# Patient Record
Sex: Female | Born: 1980 | Race: White | Hispanic: No | Marital: Married | Smoking: Former smoker
Health system: Southern US, Community
[De-identification: ages and names within clinical notes are randomized; demographics above are authoritative.]

## PROBLEM LIST (undated history)

## (undated) ENCOUNTER — Inpatient Hospital Stay: Payer: Self-pay

## (undated) DIAGNOSIS — E559 Vitamin D deficiency, unspecified: Secondary | ICD-10-CM

## (undated) DIAGNOSIS — O26819 Pregnancy related exhaustion and fatigue, unspecified trimester: Secondary | ICD-10-CM

## (undated) DIAGNOSIS — O99210 Obesity complicating pregnancy, unspecified trimester: Secondary | ICD-10-CM

## (undated) DIAGNOSIS — F419 Anxiety disorder, unspecified: Secondary | ICD-10-CM

## (undated) HISTORY — DX: Pregnancy related exhaustion and fatigue, unspecified trimester: O26.819

## (undated) HISTORY — DX: Obesity complicating pregnancy, unspecified trimester: O99.210

## (undated) HISTORY — DX: Vitamin D deficiency, unspecified: E55.9

## (undated) HISTORY — DX: Anxiety disorder, unspecified: F41.9

---
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
## 2001-07-18 ENCOUNTER — Other Ambulatory Visit: Admission: RE | Admit: 2001-07-18 | Discharge: 2001-07-18 | Payer: Self-pay | Admitting: Gynecology

## 2006-05-17 ENCOUNTER — Ambulatory Visit (HOSPITAL_COMMUNITY): Admission: RE | Admit: 2006-05-17 | Discharge: 2006-05-17 | Payer: Self-pay | Admitting: Obstetrics & Gynecology

## 2006-07-15 ENCOUNTER — Ambulatory Visit (HOSPITAL_COMMUNITY): Admission: RE | Admit: 2006-07-15 | Discharge: 2006-07-15 | Payer: Self-pay | Admitting: Obstetrics & Gynecology

## 2006-07-22 ENCOUNTER — Ambulatory Visit (HOSPITAL_COMMUNITY): Admission: RE | Admit: 2006-07-22 | Discharge: 2006-07-22 | Payer: Self-pay | Admitting: Obstetrics & Gynecology

## 2006-08-27 ENCOUNTER — Ambulatory Visit (HOSPITAL_COMMUNITY): Admission: RE | Admit: 2006-08-27 | Discharge: 2006-08-27 | Payer: Self-pay | Admitting: Obstetrics & Gynecology

## 2006-12-05 ENCOUNTER — Inpatient Hospital Stay (HOSPITAL_COMMUNITY): Admission: AD | Admit: 2006-12-05 | Discharge: 2006-12-08 | Payer: Self-pay | Admitting: Obstetrics and Gynecology

## 2008-04-03 IMAGING — US US OB DETAIL+14 WK
1 series · 13 of 28 positions shown · non-contrast
Comparison: none

CLINICAL DATA: Anatomic evaluation.  No current problems.

[Series 1: us ob detail+14 wk · 0.31mm/px · 13 of 88 slices shown]
[im 4/88]
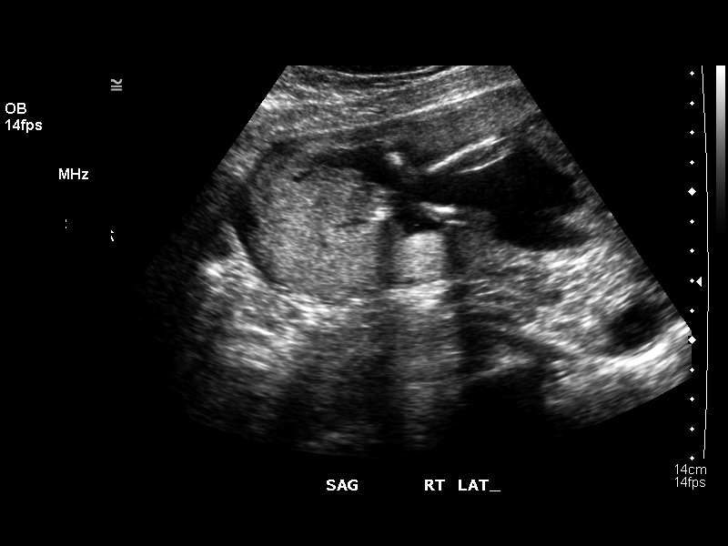
[im 10/88]
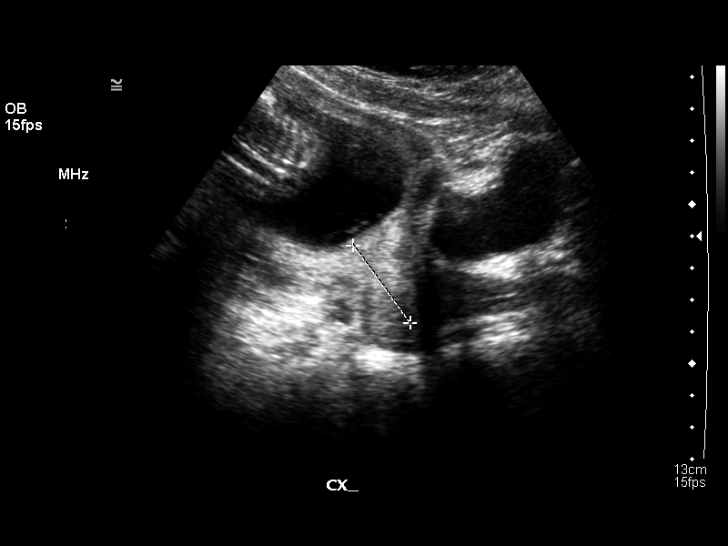
[im 17/88]
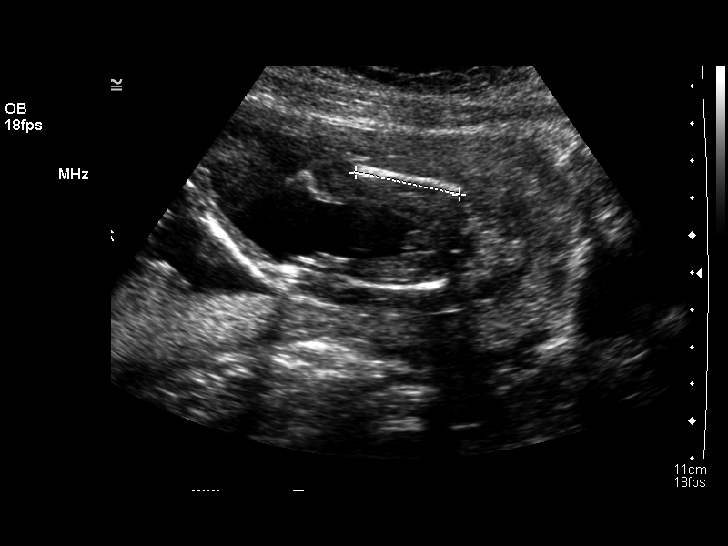
[im 23/88]
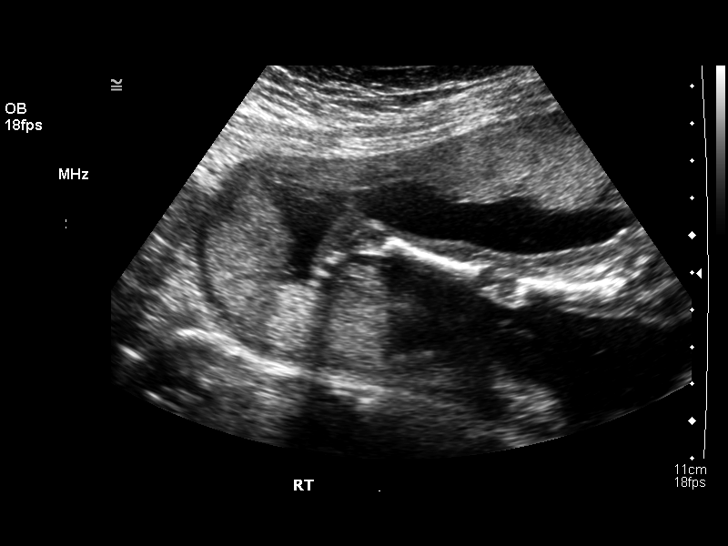
[im 30/88]
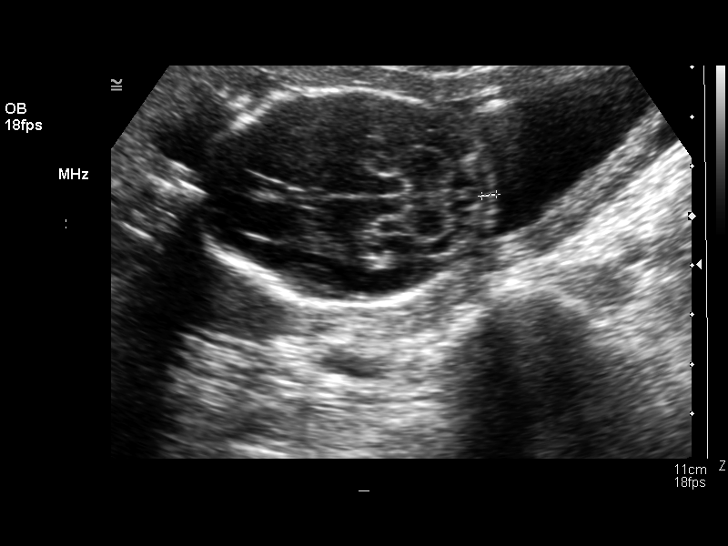
[im 36/88]
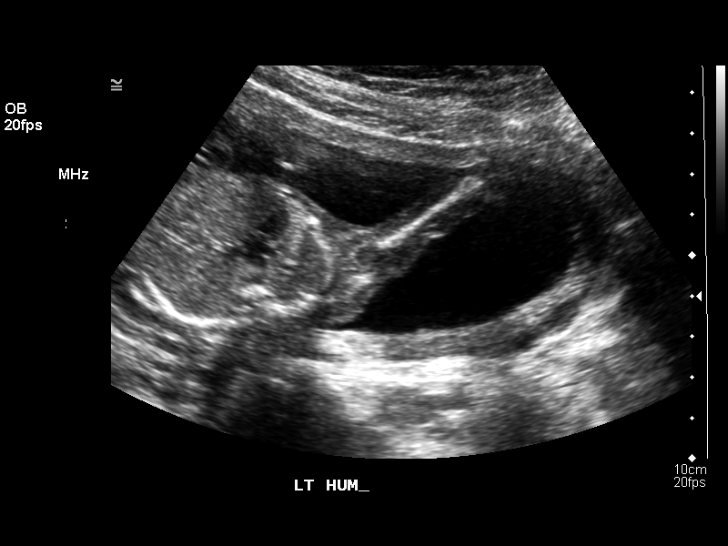
[im 46/88]
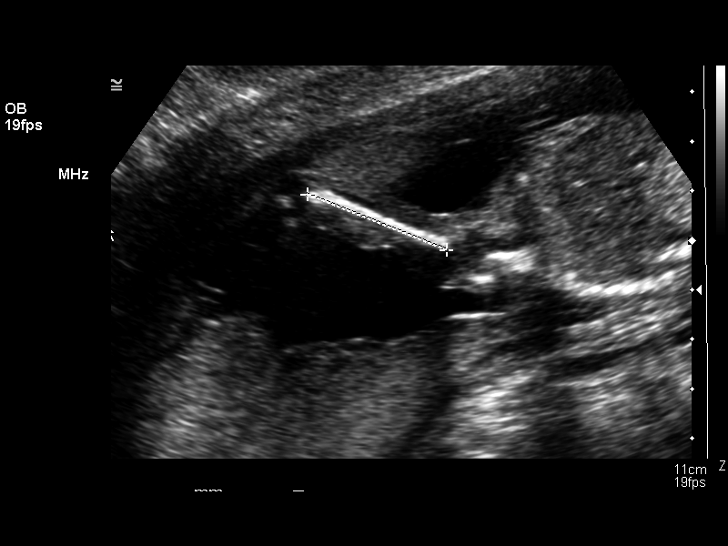
[im 52/88]
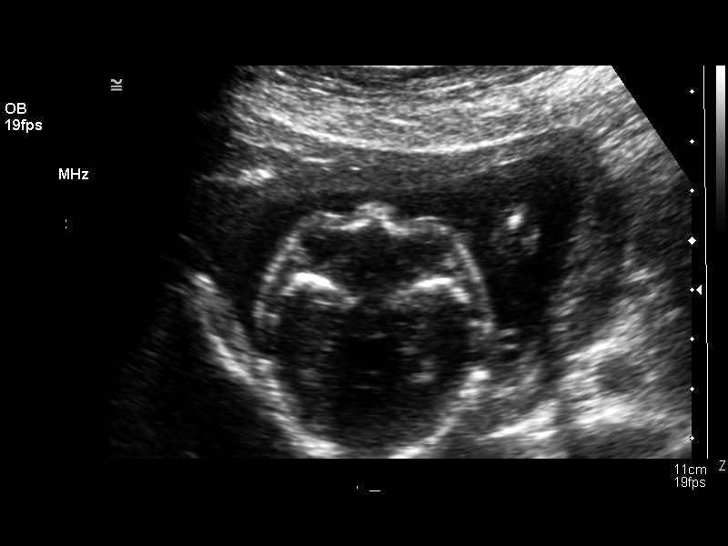
[im 59/88]
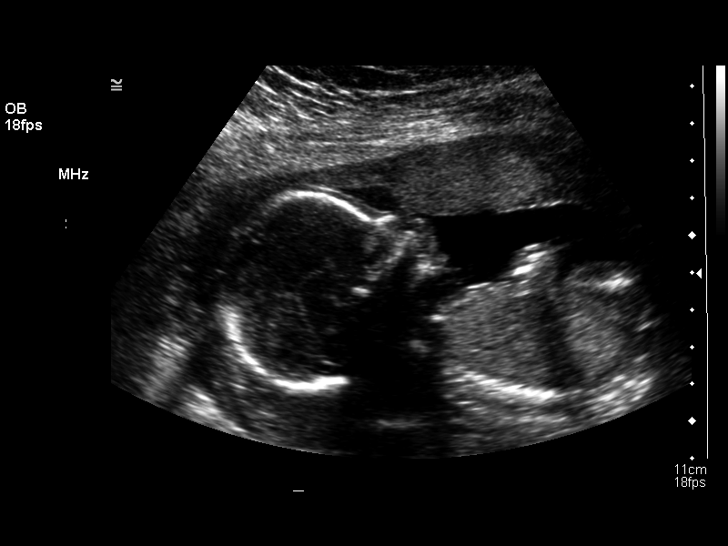
[im 65/88]
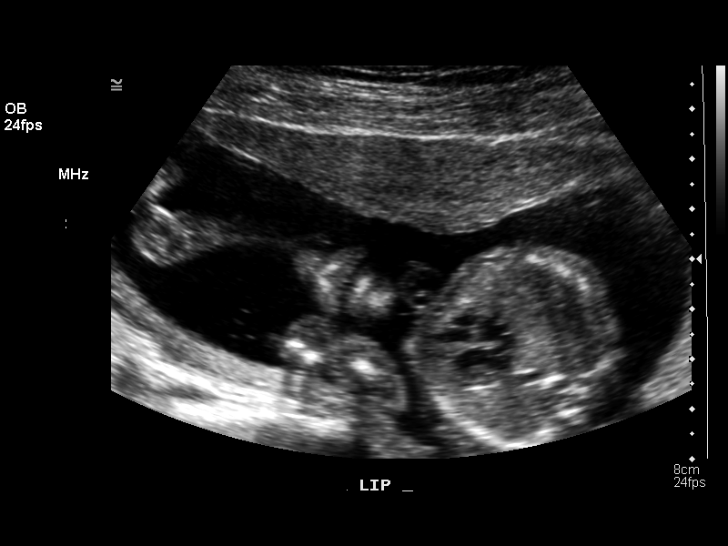
[im 71/88]
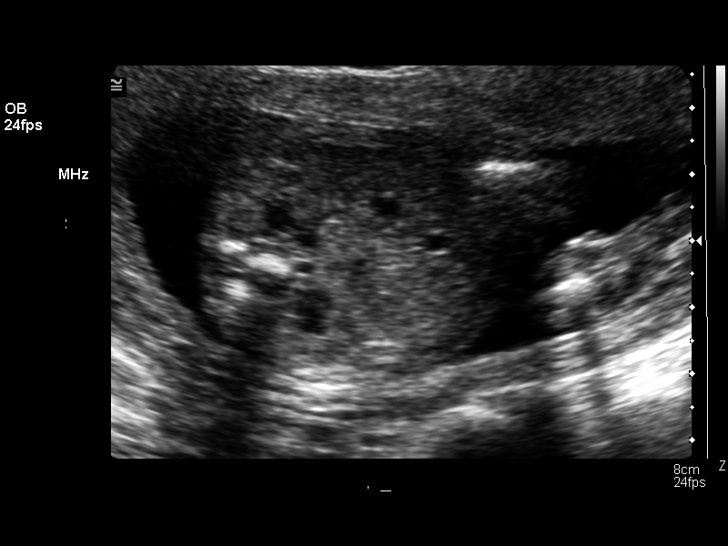
[im 78/88]
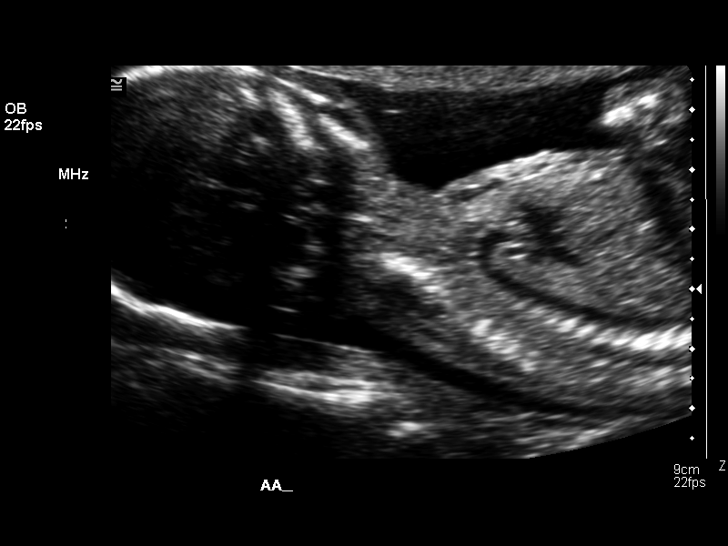
[im 84/88]
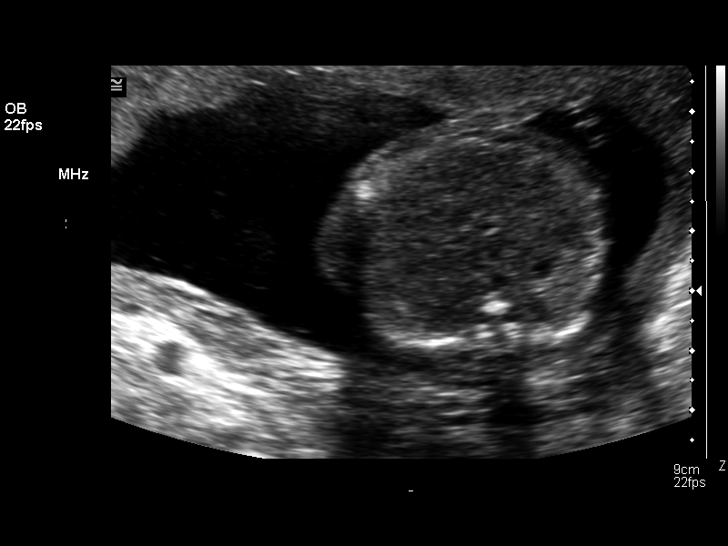

[13 of 28 positions shown; findings below may reference images not displayed]

OBSTETRICAL ULTRASOUND:
 Number of Fetuses:  1
 Heart Rate:  141 bpm
 Movement:  Yes
 Breathing:  No  
 Presentation:  Breech
 Placental Location:  Right lateral
 Grade:  I
 Previa:  No
 Amniotic Fluid (Subjective):  Normal
 Amniotic Fluid (Objective):   3.1 cm vertical pocket

 FETAL BIOMETRY
 BPD:  4.5 cm  19 w 5 d
 HC:  16.9 cm  19 w 4 d
 AC:  14.1 cm   19 w 3 d
 FL:  2.9 cm  18 w 6 d
 HL:  3.0 cm  20 w 0 d

 MEAN GA:  19 w 4 d   US EDC:  12/05/06  
 Assigned GA:  18 w 4 d  Assigned EDC:  12/12/06

 FETAL ANATOMY
 Lateral Ventricles:  Visualized 
 Thalami/CSP:  Visualized 
 Posterior Fossa:  Visualized 
 Nuchal Region:  NF= 3.0 mm  Visualized   
 Spine:  Not visualized 
 4 Chamber Heart on Left:  Visualized 
 Stomach on Left:  Visualized 
 3 Vessel Cord:  Visualized 
 Cord Insertion site:  Visualized 
 Kidneys:  Visualized 
 Bladder:  Visualized   
 Extremities:  Visualized    

 ADDITIONAL ANATOMY VISUALIZED:  LVOT, RVOT, upper lip, orbits, profile, diaphragm, heel, 5th digit, ductal arch, and aortic arch.

 MATERNAL UTERINE AND ADNEXAL FINDINGS
 Cervix:   3.0 cm transabdominal.
IMPRESSION: 1.  Single intrauterine pregnancy demonstrating an estimated gestational age by ultrasound of 19 weeks 4 days.  This is 1 week ahead of assigned gestational age by LMP of 18 weeks 4 days.  
 2.  The anatomic exam was notable for an initial small stomach size, which demonstrated filling during the course of the evaluation and by the end of the exam was felt to be within normal limits.  The fetal spine could not be assessed with clarity due to persistent spine-down fetal positioning.  The remainder of the anatomic evaluation was within normal limits.  Follow-up evaluation has been scheduled at the patient?s convenience in approximately 1 week for hopeful improved visualization of the fetal spine.  Reassessment of the fetal stomach can be undertaken at this time as well to confirm normal filled appearance.  
 3.  Subjectively and quantitatively normal amniotic fluid volume and normal cervical length.

## 2008-05-16 IMAGING — US US OB FOLLOW-UP
1 series · 13 of 24 positions shown · non-contrast
Comparison: none

CLINICAL DATA: Evaluate growth; hypertension; assigned gestational age is 24 weeks 5 days.

[Series 1: us ob follow-up · 0.33mm/px · 13 of 24 slices shown]
[im 1/24]
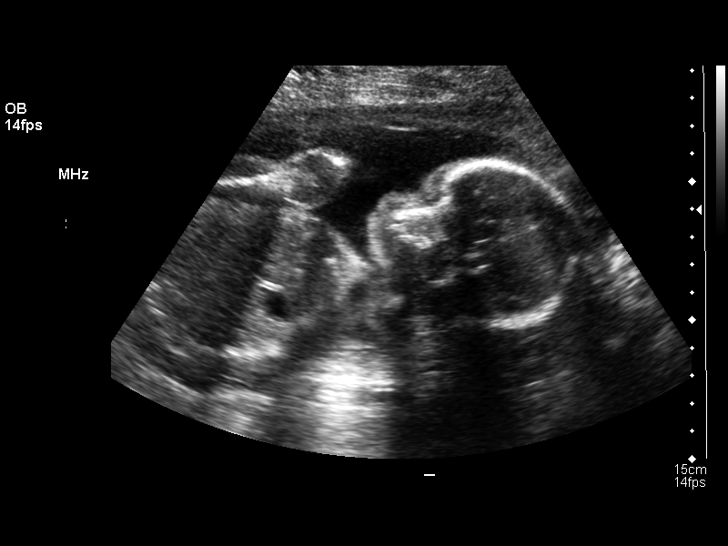
[im 3/24]
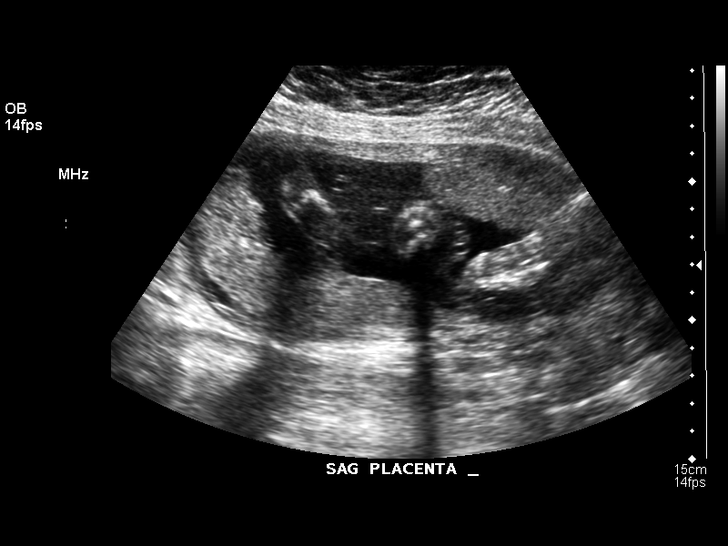
[im 5/24]
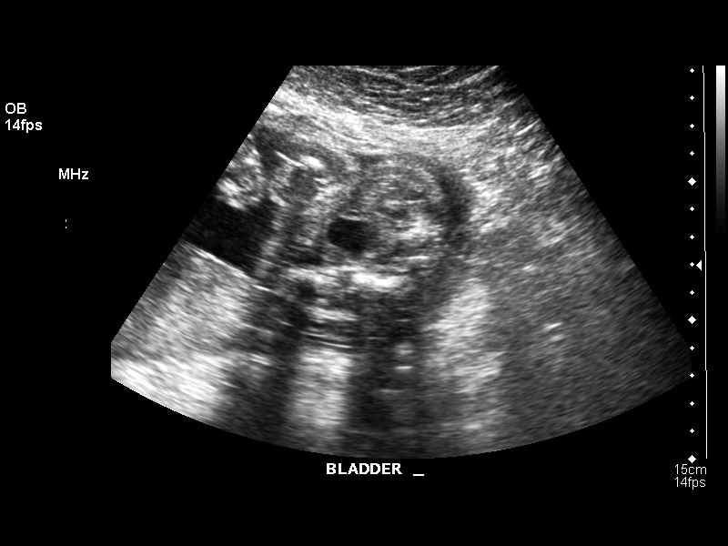
[im 7/24]
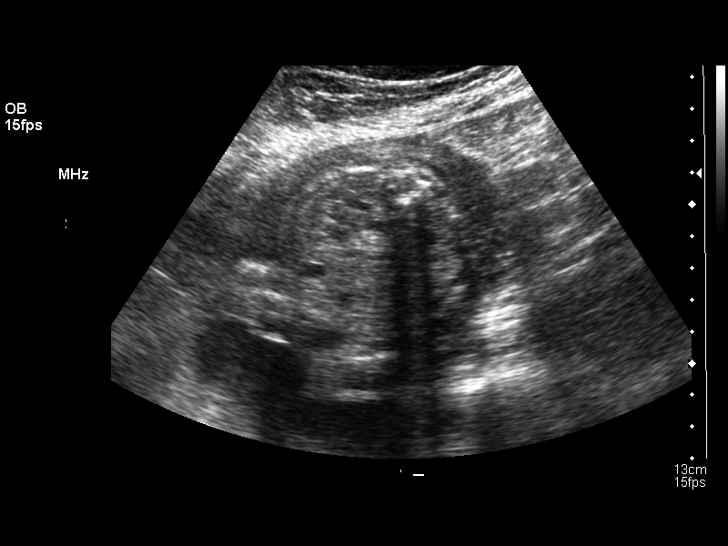
[im 9/24]
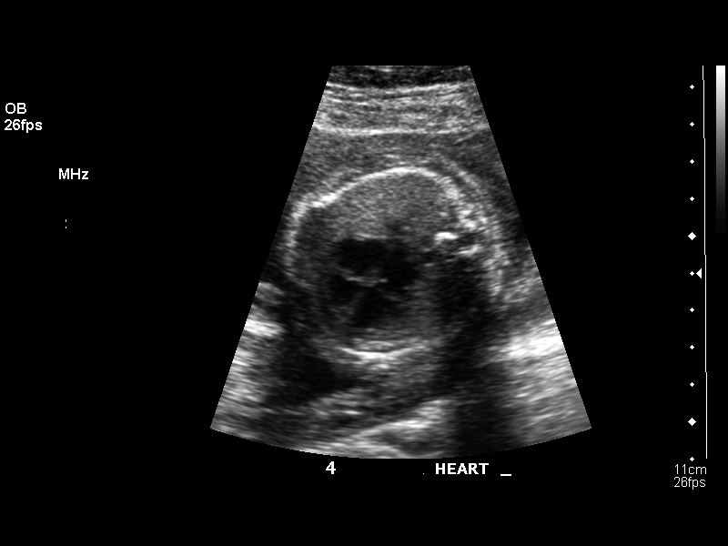
[im 11/24]
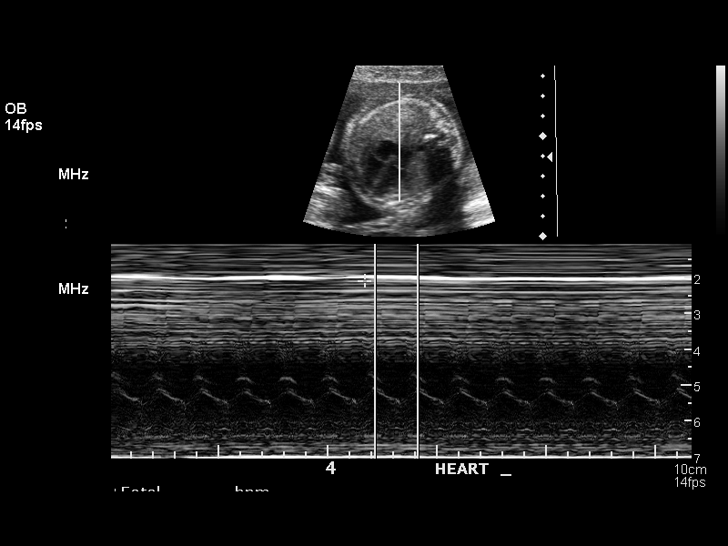
[im 13/24]
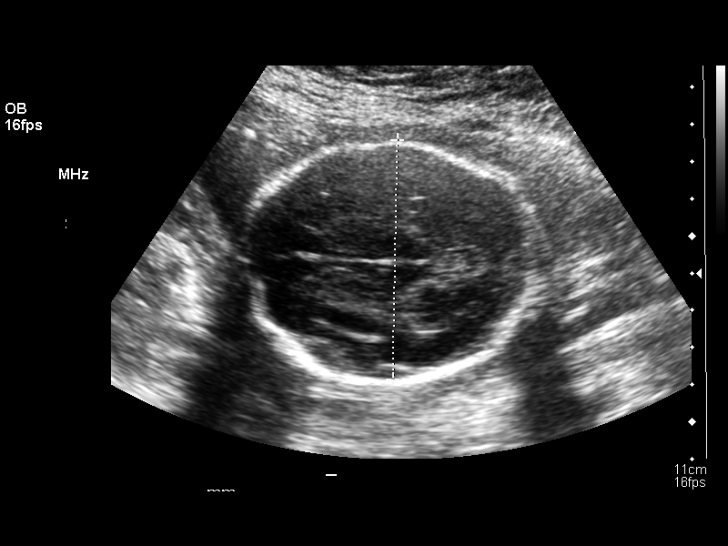
[im 14/24]
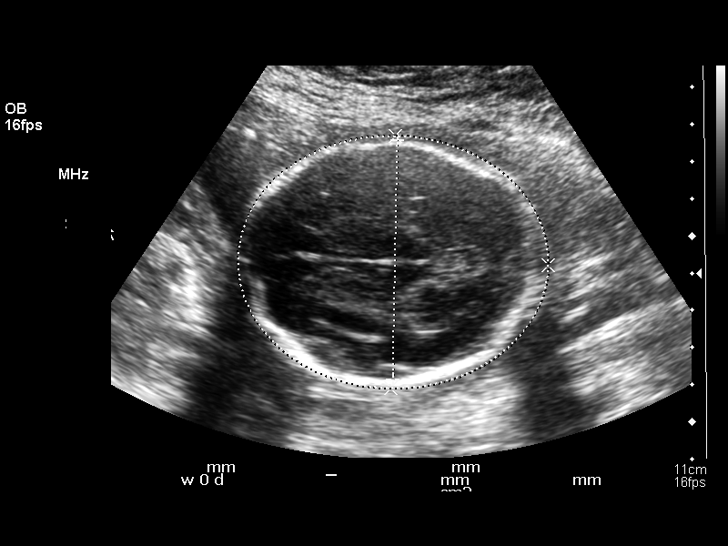
[im 16/24]
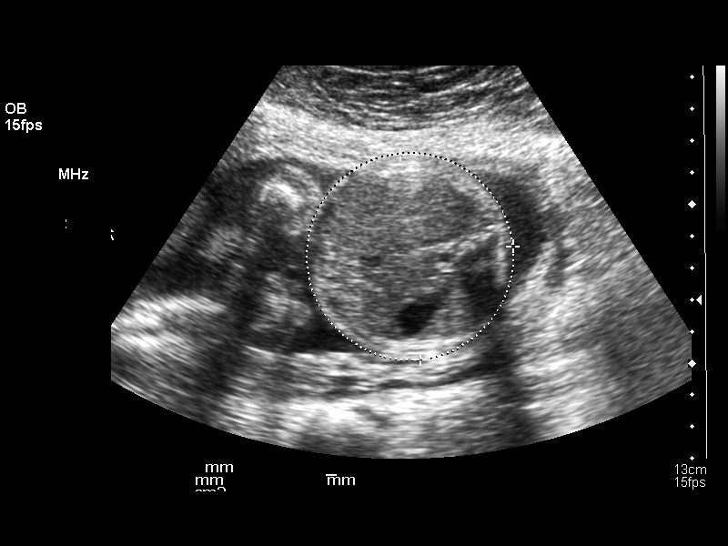
[im 18/24]
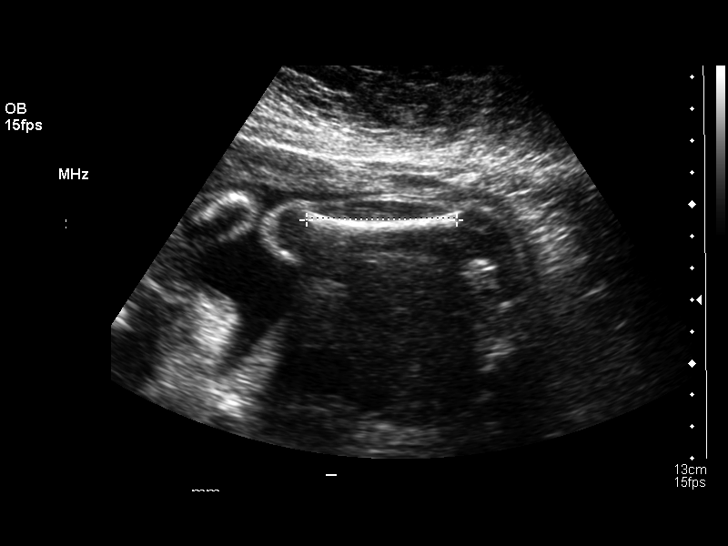
[im 20/24]
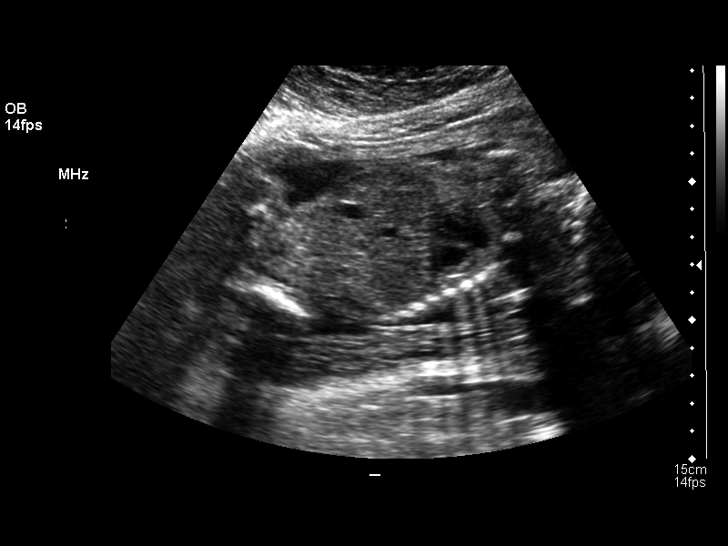
[im 22/24]
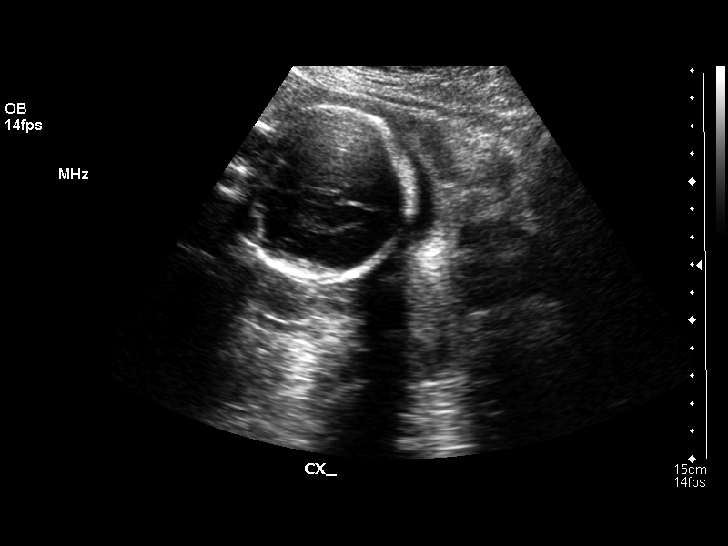
[im 24/24]
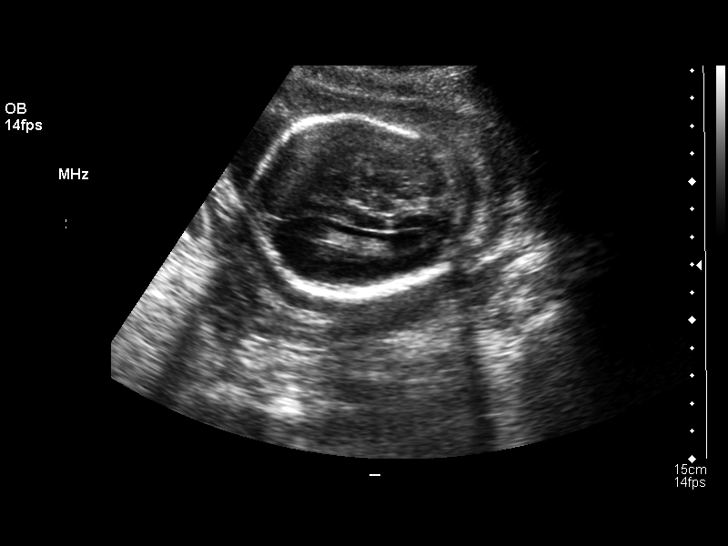

[13 of 24 positions shown; findings below may reference images not displayed]

OBSTETRICAL ULTRASOUND RE-EVALUATION:
 Number of Fetuses:  1
 Heart Rate:  153 bpm
 Movement:  Yes
 Breathing:  No
 Presentation:  Cephalic
 Placental Location:  Posterior, right lateral
 Grade:  I
 Previa:  No
 Amniotic Fluid (subjective):  Normal
 Amniotic Fluid (objective):  4.4 cm vertical pocket 

 FETAL BIOMETRY
 BPD:  6.4 cm   26 w 0 d 
 HC:  23.7 cm   25 w 5 d 
 AC:  20.3 cm   24 w 6 d 
 FL:  4.7 cm   25 w 4 d 

 Mean GA: 25 w 4 d   US EDC:  24 w 5 d 
 Assigned GA:  24 w 5 d   Assigned EDC:  12/12/06

 EFW:  792 grams 50th ? 75th %ile (740 ? 890 g) for 25 weeks 

 FETAL ANATOMY
 Lateral Ventricles:  Visualized 
 Thalami/CSP:  Previously visualized 
 Posterior Fossa:  Previously visualized 
 Nuchal Region:  Previously visualized 
 Spine:  Previously visualized 
 4 Chamber Heart on Left:  Visualized 
 Stomach on Left:  Visualized 
 3 Vessel Cord:  Previously visualized 
 Cord Insertion Site:  Previously visualized 
 Kidneys:  Visualized 
 Bladder:    Visualized 
 Extremities:  Previously visualized 

 ADDITIONAL ANATOMY VISUALIZED:  Diaphragm.  

 MATERNAL UTERINE AND ADNEXAL FINDINGS
 Cervix:  3.7 cm transabdominal.
IMPRESSION: There is a single living intrauterine gestation in cephalic presentation.  The mean gestational age by today?s ultrasound is 25 weeks 4 days, which is concordant with the assigned gestational age.  Fetal indices are concordant and the estimated fetal weight is at the 50-75th percentile for a 25 week gestation.

## 2010-11-09 ENCOUNTER — Encounter: Payer: Self-pay | Admitting: Obstetrics & Gynecology

## 2014-09-06 LAB — OB RESULTS CONSOLE GC/CHLAMYDIA
CHLAMYDIA, DNA PROBE: NEGATIVE
GC PROBE AMP, GENITAL: NEGATIVE

## 2014-09-06 LAB — OB RESULTS CONSOLE PLATELET COUNT: Platelets: 225 10*3/uL

## 2014-09-06 LAB — OB RESULTS CONSOLE ABO/RH: RH Type: POSITIVE

## 2014-09-06 LAB — OB RESULTS CONSOLE RUBELLA ANTIBODY, IGM: RUBELLA: IMMUNE

## 2014-09-06 LAB — OB RESULTS CONSOLE RPR: RPR: NONREACTIVE

## 2014-09-06 LAB — OB RESULTS CONSOLE HEPATITIS B SURFACE ANTIGEN: HEP B S AG: NEGATIVE

## 2014-09-06 LAB — OB RESULTS CONSOLE ANTIBODY SCREEN: Antibody Screen: NEGATIVE

## 2014-09-06 LAB — OB RESULTS CONSOLE HGB/HCT, BLOOD
HEMATOCRIT: 41 %
Hemoglobin: 14.1 g/dL

## 2014-09-06 LAB — OB RESULTS CONSOLE VARICELLA ZOSTER ANTIBODY, IGG: Varicella: IMMUNE

## 2014-09-06 LAB — OB RESULTS CONSOLE HIV ANTIBODY (ROUTINE TESTING): HIV: NONREACTIVE

## 2014-10-19 NOTE — L&D Delivery Note (Signed)
Delivery Note At 9:59 PM a viable female was delivered via Vaginal, Spontaneous Delivery (Presentation: Right Occiput Anterior).  APGAR: 8, 9; weight 6 lb 7.7 oz (2940 g).   Placenta status: Intact, Spontaneous.  Cord: 3 vessels with the following complications: None.  Cord pH: did not obtain.  Anesthesia: Epidural  Episiotomy: None Lacerations: right labia minora Suture Repair: 3.0 vicryl Est. Blood Loss (mL): 100  Mom to postpartum.  Baby to Couplet care / Skin to Skin.  Hildred Laser 03/28/2015, 8:32 AM

## 2014-12-26 LAB — OB RESULTS CONSOLE HGB/HCT, BLOOD
HCT: 36 %
HEMOGLOBIN: 12.4 g/dL

## 2015-03-20 ENCOUNTER — Encounter: Payer: Self-pay | Admitting: Obstetrics and Gynecology

## 2015-03-20 ENCOUNTER — Ambulatory Visit (INDEPENDENT_AMBULATORY_CARE_PROVIDER_SITE_OTHER): Payer: Medicaid Other | Admitting: Obstetrics and Gynecology

## 2015-03-20 VITALS — BP 132/79 | HR 102 | Wt 230.9 lb

## 2015-03-20 DIAGNOSIS — O208 Other hemorrhage in early pregnancy: Secondary | ICD-10-CM | POA: Insufficient documentation

## 2015-03-20 DIAGNOSIS — F411 Generalized anxiety disorder: Secondary | ICD-10-CM

## 2015-03-20 DIAGNOSIS — Z3493 Encounter for supervision of normal pregnancy, unspecified, third trimester: Secondary | ICD-10-CM

## 2015-03-20 DIAGNOSIS — O99210 Obesity complicating pregnancy, unspecified trimester: Secondary | ICD-10-CM

## 2015-03-20 DIAGNOSIS — R7309 Other abnormal glucose: Secondary | ICD-10-CM

## 2015-03-20 DIAGNOSIS — E669 Obesity, unspecified: Secondary | ICD-10-CM | POA: Insufficient documentation

## 2015-03-20 DIAGNOSIS — O418X10 Other specified disorders of amniotic fluid and membranes, first trimester, not applicable or unspecified: Secondary | ICD-10-CM | POA: Insufficient documentation

## 2015-03-20 DIAGNOSIS — E559 Vitamin D deficiency, unspecified: Secondary | ICD-10-CM | POA: Insufficient documentation

## 2015-03-20 DIAGNOSIS — F419 Anxiety disorder, unspecified: Secondary | ICD-10-CM | POA: Insufficient documentation

## 2015-03-20 DIAGNOSIS — E785 Hyperlipidemia, unspecified: Secondary | ICD-10-CM | POA: Insufficient documentation

## 2015-03-20 DIAGNOSIS — Z3483 Encounter for supervision of other normal pregnancy, third trimester: Secondary | ICD-10-CM

## 2015-03-20 DIAGNOSIS — O468X1 Other antepartum hemorrhage, first trimester: Secondary | ICD-10-CM

## 2015-03-20 LAB — POCT URINALYSIS DIPSTICK
Bilirubin, UA: NEGATIVE
Blood, UA: NEGATIVE
Glucose, UA: NEGATIVE
Ketones, UA: NEGATIVE
NITRITE UA: NEGATIVE
PH UA: 6.5
Spec Grav, UA: 1.02
UROBILINOGEN UA: 0.2

## 2015-03-20 NOTE — Patient Instructions (Signed)
RTC in 1 week for routine OB visit.  Fetal kick counts twice daily.  Labor precautions.

## 2015-03-20 NOTE — Progress Notes (Signed)
ROB: Patient c/o occasional dizziness, not affected by positional changes. Reports occasional palpitations when lying down.  Pulse with mild tachycardia today. Also with contractions x 3 hrs yesterday which subsided. Will continue to monitor. Labor precautions given. RTC in 1 week.

## 2015-03-25 ENCOUNTER — Telehealth: Payer: Self-pay | Admitting: Obstetrics and Gynecology

## 2015-03-25 NOTE — Telephone Encounter (Signed)
Spoke with pt she states that she has been experiencing contractions for the past few days. Pt states that the contractions are getting stronger in intensity however are still coming in an irregular pattern. Pt states that the contractions are approx an hour apart and last anywhere between 15-3630mins. Pt denies vaginal bleeding, and leaking vaginal fluid. Pt states she believes she has lost her mucus plug. Advised pt to go to ER immediately if she has vaginal bleeding or begins to leak vaginal fluid. Advised pt to head to L&D if contractions became regular occuring every 5-7 minutes for an hour consecutively. Pt gave verbal understanding and stated she just wanted some reassurance.

## 2015-03-26 ENCOUNTER — Encounter: Payer: Self-pay | Admitting: *Deleted

## 2015-03-26 ENCOUNTER — Observation Stay
Admission: EM | Admit: 2015-03-26 | Discharge: 2015-03-26 | Disposition: A | Discharge status: Home / Self Care | Payer: Medicaid Other | Source: Home / Self Care | Admitting: Obstetrics and Gynecology

## 2015-03-26 DIAGNOSIS — R109 Unspecified abdominal pain: Secondary | ICD-10-CM | POA: Insufficient documentation

## 2015-03-26 DIAGNOSIS — O26893 Other specified pregnancy related conditions, third trimester: Secondary | ICD-10-CM

## 2015-03-26 NOTE — Discharge Instructions (Signed)
Reviewed labor precautions. Pt to keep appt with Dr Valentino Saxonherry on Thurs as scheduled. Encouraged to call her Dr. Maxie Barbr L and D dept if further concerns or questions arise. Pt and spouse voiced understanding, no further questons or concerns at this time.

## 2015-03-26 NOTE — Progress Notes (Signed)
Pt and spouse provided with labor precaution instructions. Encouraged to call Dr or [facility] de[facility] with any further questions or concerns. Pt instructed o keep her appt. with Dr Valentino Saxonherry on Thurs as scheduled. Pt and spouse verbalizing understanding. No further questions asked. Pt left dept. Walking with spouse to ER dept parking.

## 2015-03-26 NOTE — Progress Notes (Signed)
BP cuff switched and retaken

## 2015-03-26 NOTE — Progress Notes (Signed)
Dr Valentino Saxonherry stating that pt may walk if desired. Re-evaluate cx in 1 hour or prn.

## 2015-03-27 ENCOUNTER — Encounter: Payer: Self-pay | Admitting: *Deleted

## 2015-03-27 ENCOUNTER — Encounter: Payer: Self-pay | Admitting: Obstetrics and Gynecology

## 2015-03-27 ENCOUNTER — Inpatient Hospital Stay: Payer: Medicaid Other | Admitting: Anesthesiology

## 2015-03-27 ENCOUNTER — Inpatient Hospital Stay
Admission: EM | Admit: 2015-03-27 | Discharge: 2015-03-29 | DRG: 775 | Disposition: A | Discharge status: Home / Self Care | Payer: Medicaid Other | Attending: Obstetrics and Gynecology | Admitting: Obstetrics and Gynecology

## 2015-03-27 ENCOUNTER — Observation Stay
Admission: EM | Admit: 2015-03-27 | Discharge: 2015-03-27 | Disposition: A | Discharge status: Home / Self Care | Payer: Medicaid Other | Source: Home / Self Care | Attending: Emergency Medicine | Admitting: Emergency Medicine

## 2015-03-27 DIAGNOSIS — O468X1 Other antepartum hemorrhage, first trimester: Secondary | ICD-10-CM

## 2015-03-27 DIAGNOSIS — Z3A39 39 weeks gestation of pregnancy: Secondary | ICD-10-CM | POA: Diagnosis present

## 2015-03-27 DIAGNOSIS — Z3493 Encounter for supervision of normal pregnancy, unspecified, third trimester: Secondary | ICD-10-CM | POA: Diagnosis present

## 2015-03-27 DIAGNOSIS — O99213 Obesity complicating pregnancy, third trimester: Secondary | ICD-10-CM | POA: Diagnosis present

## 2015-03-27 DIAGNOSIS — E559 Vitamin D deficiency, unspecified: Secondary | ICD-10-CM | POA: Diagnosis present

## 2015-03-27 DIAGNOSIS — R000 Tachycardia, unspecified: Secondary | ICD-10-CM | POA: Diagnosis present

## 2015-03-27 DIAGNOSIS — O99284 Endocrine, nutritional and metabolic diseases complicating childbirth: Secondary | ICD-10-CM | POA: Diagnosis present

## 2015-03-27 DIAGNOSIS — Z3483 Encounter for supervision of other normal pregnancy, third trimester: Secondary | ICD-10-CM | POA: Diagnosis not present

## 2015-03-27 DIAGNOSIS — O418X10 Other specified disorders of amniotic fluid and membranes, first trimester, not applicable or unspecified: Secondary | ICD-10-CM

## 2015-03-27 LAB — CBC
HCT: 36.7 % (ref 35.0–47.0)
Hemoglobin: 11.9 g/dL — ABNORMAL LOW (ref 12.0–16.0)
MCH: 28.1 pg (ref 26.0–34.0)
MCHC: 32.4 g/dL (ref 32.0–36.0)
MCV: 86.9 fL (ref 80.0–100.0)
Platelets: 191 10*3/uL (ref 150–440)
RBC: 4.23 MIL/uL (ref 3.80–5.20)
RDW: 12.6 % (ref 11.5–14.5)
WBC: 13.8 10*3/uL — ABNORMAL HIGH (ref 3.6–11.0)

## 2015-03-27 LAB — TYPE AND SCREEN
ABO/RH(D): B POS
Antibody Screen: NEGATIVE

## 2015-03-27 LAB — ABO/RH: ABO/RH(D): B POS

## 2015-03-27 MED ORDER — OXYTOCIN 40 UNITS IN LACTATED RINGERS INFUSION - SIMPLE MED
INTRAVENOUS | Status: AC
  Administered 2015-03-27: 62.5 mL/h via INTRAVENOUS
  Filled 2015-03-27: qty 1000

## 2015-03-27 MED ORDER — METOCLOPRAMIDE HCL 10 MG PO TABS
10.0000 mg | ORAL_TABLET | Freq: Once | ORAL | Status: DC
  Filled 2015-03-27: qty 1

## 2015-03-27 MED ORDER — MISOPROSTOL 200 MCG PO TABS
ORAL_TABLET | ORAL | Status: AC
  Filled 2015-03-27: qty 4

## 2015-03-27 MED ORDER — LACTATED RINGERS IV SOLN
INTRAVENOUS | Status: DC
  Administered 2015-03-27: 20:00:00 via INTRAVENOUS

## 2015-03-27 MED ORDER — DIPHENHYDRAMINE HCL 50 MG/ML IJ SOLN: 12.5000 mg | INTRAMUSCULAR | Status: DC | PRN

## 2015-03-27 MED ORDER — FENTANYL 2.5 MCG/ML W/ROPIVACAINE 0.2% IN NS 100 ML EPIDURAL INFUSION (ARMC-ANES)
EPIDURAL | Status: AC
  Administered 2015-03-27: 20 mL/h via EPIDURAL
  Administered 2015-03-27: 9 mL/h via EPIDURAL
  Filled 2015-03-27: qty 100

## 2015-03-27 MED ORDER — BUTORPHANOL TARTRATE 1 MG/ML IJ SOLN
INTRAMUSCULAR | Status: AC
Start: 2015-03-27 — End: 2015-03-27
  Administered 2015-03-27: 1 mg via INTRAVENOUS
  Filled 2015-03-27: qty 2

## 2015-03-27 MED ORDER — EPHEDRINE 5 MG/ML INJ
10.0000 mg | INTRAVENOUS | Status: DC | PRN
  Filled 2015-03-27: qty 2

## 2015-03-27 MED ORDER — FAMOTIDINE 20 MG PO TABS
40.0000 mg | ORAL_TABLET | Freq: Once | ORAL | Status: DC
  Filled 2015-03-27: qty 1

## 2015-03-27 MED ORDER — FENTANYL 2.5 MCG/ML W/ROPIVACAINE 0.2% IN NS 100 ML EPIDURAL INFUSION (ARMC-ANES)
9.0000 mL/h | EPIDURAL | Status: DC
  Administered 2015-03-27: 9 mL/h via EPIDURAL

## 2015-03-27 MED ORDER — HYDROCODONE-ACETAMINOPHEN 5-325 MG PO TABS
1.0000 | ORAL_TABLET | Freq: Once | ORAL | Status: AC | PRN
  Administered 2015-03-27: 1 via ORAL

## 2015-03-27 MED ORDER — CITRIC ACID-SODIUM CITRATE 334-500 MG/5ML PO SOLN: 30.0000 mL | ORAL | Status: DC | PRN

## 2015-03-27 MED ORDER — AMMONIA AROMATIC IN INHA
RESPIRATORY_TRACT | Status: AC
  Filled 2015-03-27: qty 10

## 2015-03-27 MED ORDER — LIDOCAINE HCL (PF) 1 % IJ SOLN
INTRAMUSCULAR | Status: AC
  Filled 2015-03-27: qty 30

## 2015-03-27 MED ORDER — LACTATED RINGERS IV SOLN: INTRAVENOUS | Status: DC

## 2015-03-27 MED ORDER — OXYTOCIN 40 UNITS IN LACTATED RINGERS INFUSION - SIMPLE MED
62.5000 mL/h | INTRAVENOUS | Status: DC
  Administered 2015-03-27: 62.5 mL/h via INTRAVENOUS

## 2015-03-27 MED ORDER — OXYTOCIN BOLUS FROM INFUSION
500.0000 mL | INTRAVENOUS | Status: DC
  Administered 2015-03-27: 500 mL via INTRAVENOUS

## 2015-03-27 MED ORDER — HYDROCODONE-ACETAMINOPHEN 5-325 MG PO TABS
ORAL_TABLET | ORAL | Status: AC
  Administered 2015-03-27: 1 via ORAL
  Filled 2015-03-27: qty 1

## 2015-03-27 MED ORDER — ACETAMINOPHEN 325 MG PO TABS: 650.0000 mg | ORAL_TABLET | ORAL | Status: DC | PRN

## 2015-03-27 MED ORDER — LACTATED RINGERS IV SOLN: 500.0000 mL | INTRAVENOUS | Status: DC | PRN

## 2015-03-27 MED ORDER — OXYTOCIN 10 UNIT/ML IJ SOLN
INTRAMUSCULAR | Status: AC
  Filled 2015-03-27: qty 2

## 2015-03-27 MED ORDER — ONDANSETRON HCL 4 MG/2ML IJ SOLN: 4.0000 mg | Freq: Four times a day (QID) | INTRAMUSCULAR | Status: DC | PRN

## 2015-03-27 MED ORDER — PHENYLEPHRINE 40 MCG/ML (10ML) SYRINGE FOR IV PUSH (FOR BLOOD PRESSURE SUPPORT)
80.0000 ug | PREFILLED_SYRINGE | INTRAVENOUS | Status: DC | PRN
  Filled 2015-03-27: qty 2

## 2015-03-27 MED ORDER — BUTORPHANOL TARTRATE 1 MG/ML IJ SOLN
1.0000 mg | INTRAMUSCULAR | Status: DC | PRN
  Administered 2015-03-27: 1 mg via INTRAVENOUS

## 2015-03-27 NOTE — H&P (Addendum)
Obstetric History and Physical  Christie Harrison is a 34 y.o. G2P1001 with IUP at 21w3dpresenting for contractions. LMP 06/25/2015, EDD 03/31/2015 by dates consistent with 7 week sono. Patient states she has been having  regular, every 2-4 minutes contractions, no vaginal bleeding, intact membranes, with active fetal movement.    Prenatal Course Source of Care: [facility]  with onset of care at 10 weeks Pregnancy complications or risks: Patient Active Problem List   Diagnosis Date Noted  . Labor and delivery, indication for care 03/27/2015  . Abdominal pain 03/26/2015  . Obesity in pregnancy 03/20/2015  . Anxiety disorder 03/20/2015  . Glucose tolerance test abnormal (1 hr abnormal, normal 3 hr GTT) 03/20/2015  . Subchorionic hemorrhage in first trimester 03/20/2015  . Vitamin D deficiency 03/20/2015  . Elevated lipids 03/20/2015   She plans to breastfeed She is unsure of desires for postpartum contraception.   Prenatal labs and studies: ABO, Rh: --/--/B POS (06/08 1646) Antibody: NEG (06/08 1646) Rubella: Non- Immune (11/19 2015) RPR: Nonreactive (11/19 2015)  HBsAg: Negative (11/19 2015)  HIV: Non-reactive (11/19 2015)  GBS: negative 1 hr Glucola: abnormal (146); 3 hr GTT normal Genetic screening declined [employer]    Past Medical History  Diagnosis Date  . Anxiety   . Obesity in pregnancy   . Fatigue in pregnancy   . Vitamin D deficiency     Past Surgical History  Procedure Laterality Date  . Mandible fracture surgery  1996    OB History  Gravida Para Term Preterm AB SAB TAB Ectopic Multiple Living  2 1 1       1     # Outcome Date GA Lbr Len/2nd Weight Sex Delivery Anes PTL Lv  2 Current           1 Term 12/06/06 380w0d3.232 kg (7 lb 2 oz) M Vag-Spont EPI N Y    Obstetric Comments  Pt reports back labor with first labor, vag delivery with epidural    History   Social History  . Marital Status: Married    Spouse Name: N/A  . Number of  Children: N/A  . Years of Education: N/A   Social History Main Topics  . Smoking status: Former Smoker -- 0.25 packs/day for 12 years    Types: Cigarettes    Quit date: 02/16/2006  . Smokeless tobacco: Never Used  . Alcohol Use: No  . Drug Use: No  . Sexual Activity: Yes    Birth Control/ Protection: None     Comment: Currently Pregnant   Other Topics Concern  . None   Social History Narrative    Family History  Problem Relation Age of Onset  . Heart block Maternal Grandfather   . Vision loss Maternal Grandfather   . Vision loss Maternal Grandmother     Prescriptions prior to admission  Medication Sig Dispense Refill Last Dose  . Prenatal Vit-Fe Fumarate-FA (MULTIVITAMIN-PRENATAL) 27-0.8 MG TABS tablet Take 1 tablet by mouth daily at 12 noon.   03/27/2015 at Unknown time    No Known Allergies  Review of Systems: Negative except for what is mentioned in HPI.  Physical Exam: BP 112/27 mmHg  Pulse 123  Temp(Src) 98.4 F (36.9 C) (Oral)  Resp 16  Ht 5' 4"  (1.626 m)  Wt 108.863 kg (240 lb)  BMI 41.18 kg/m2 CONSTITUTIONAL: Well-developed, well-nourished female in no moderate distress.  HENT:  Normocephalic, atraumatic, External right and left ear normal. Oropharynx is clear  and moist EYES: Conjunctivae and EOM are normal. Pupils are equal, round, and reactive to light. No scleral icterus.  NECK: Normal range of motion, supple, no masses SKIN: Skin is warm and dry. No rash noted. Not diaphoretic. No erythema. No pallor. [place]: Alert and oriented to person, place, and time. Normal reflexes, muscle tone coordination. No cranial nerve deficit noted. PSYCHIATRIC: Normal mood and affect. Normal behavior. Normal judgment and thought content. CARDIOVASCULAR: Normal heart rate noted, regular rhythm RESPIRATORY: Effort and breath sounds normal, no problems with respiration noted ABDOMEN: Soft, nontender, nondistended, gravid. MUSCULOSKELETAL: Normal range of motion. No edema  and no tenderness. 2+ distal pulses.  Cervical Exam: Dilatation 4-5cm   Effacement 90%   Station -3   Presentation: cephalic FHT:  Baseline rate 140 bpm   Variability moderate  Accelerations present   Decelerations none Contractions: Every 2-3 mins   Pertinent Labs/Studies:   Results for orders placed or performed during the hospital encounter of 03/27/15 (from the past 24 hour(s))  Type and screen     Status: None   Collection Time: 03/27/15  4:46 PM  Result Value Ref Range   ABO/RH(D) B POS    Antibody Screen NEG    Sample Expiration 03/30/2015   CBC     Status: Abnormal   Collection Time: 03/27/15  4:54 PM  Result Value Ref Range   WBC 13.8 (H) 3.6 - 11.0 K/uL   RBC 4.23 3.80 - 5.20 MIL/uL   Hemoglobin 11.9 (L) 12.0 - 16.0 g/dL   HCT 36.7 35.0 - 47.0 %   MCV 86.9 80.0 - 100.0 fL   MCH 28.1 26.0 - 34.0 pg   MCHC 32.4 32.0 - 36.0 g/dL   RDW 12.6 11.5 - 14.5 %   Platelets 191 150 - 440 K/uL    Assessment : Christie Harrison is a 34 y.o. G2P1001 at 18w3dbeing admitted for labor.  Plan: Labor: Expectant management.  Induction/Augmentation as needed, per protocol. AROM'd at admission with clear fluid.  Stadol for pain, epidural when desired.  FWB: Reassuring fetal heart tracing.  GBS negative Delivery plan: Anticipate  vaginal delivery Needs MMR postpartum.    ARubie Maid MD  [facility] 03/27/2015 6:52 PM

## 2015-03-27 NOTE — Anesthesia Procedure Notes (Signed)
Epidural Patient location during procedure: OB  Staffing Anesthesiologist: Christie AddisonHOMAS, Christie Harrison Performed by: anesthesiologist   Preanesthetic Checklist Completed: patient identified, site marked, surgical consent, pre-op evaluation, timeout performed, IV checked, risks and benefits discussed and monitors and equipment checked  Epidural Patient position: sitting Prep: Betadine Patient monitoring: heart rate, continuous pulse ox and blood pressure Approach: midline Location: L4-L5 Injection technique: LOR saline  Needle:  Needle type: Tuohy  Needle gauge: 18 G Needle length: 9 cm and 9 Catheter type: closed end flexible Catheter size: 20 Guage Test dose: negative and 1.5% lidocaine with Epi 1:200 K  Assessment Sensory level: T10 Events: blood not aspirated, injection not painful, no injection resistance, negative IV test and no paresthesia  Additional Notes   Patient tolerated the insertion well without complications.1808 start. 1814 test dose 3cc 1.5% xylo, 1820 fent and ropivicaine infusion.Reason for block:procedure for pain

## 2015-03-27 NOTE — Anesthesia Preprocedure Evaluation (Signed)
Anesthesia Evaluation  Patient identified by MRN, date of birth, ID band Patient awake    Reviewed: Allergy & Precautions, NPO status , Patient's Chart, lab work & pertinent test results  Airway Mallampati: III  TM Distance: >3 FB     Dental  (+) Chipped   Pulmonary former smoker,          Cardiovascular     Neuro/Psych PSYCHIATRIC DISORDERS Anxiety    GI/Hepatic   Endo/Other    Renal/GU      Musculoskeletal   Abdominal   Peds  Hematology   Anesthesia Other Findings Obesity.  Reproductive/Obstetrics                             Anesthesia Physical Anesthesia Plan  ASA: II  Anesthesia Plan: Epidural   Post-op Pain Management:    Induction:   Airway Management Planned:   Additional Equipment:   Intra-op Plan:   Post-operative Plan:   Informed Consent: I have reviewed the patients History and Physical, chart, labs and discussed the procedure including the risks, benefits and alternatives for the proposed anesthesia with the patient or authorized representative who has indicated his/her understanding and acceptance.     Plan Discussed with:   Anesthesia Plan Comments:         Anesthesia Quick Evaluation

## 2015-03-27 NOTE — Progress Notes (Signed)
Patient 4-590/-3.  Will admit to Labor and Delivery.  SROM'd at 4:35 pm, clear fluid.

## 2015-03-27 NOTE — Progress Notes (Signed)
Intrapartum Progress Note  S: Patient without complaints. Comfortable with epidural.  O: Blood pressure 125/66, pulse 129, temperature 98.6 F (37 C), temperature source Oral, resp. rate 22, height 5\' 4"  (1.626 m), weight 108.863 kg (240 lb). Gen App: NAD, comfortable Abdomen: soft, gravid FHT: 150 bpm, acce3ls present, no decels.  Good variability.  Tocometer: 2-3 cm Cervix:  9.5/90/+2/cephalic. AROM'd.  Extremities: Nontender, no edema.  Labs:  Lab Results  Component Value Date   WBC 13.8* 03/27/2015   HGB 11.9* 03/27/2015   HCT 36.7 03/27/2015   MCV 86.9 03/27/2015   PLT 191 03/27/2015    Assessment:  1: SIUP at 1538w3d 2. Tachycardia (120s-140s, asymptomatic) since admission. Did not resolve after epidural placed and pain decreased.  Plan:  1) Anticipate vaginal delivery soon.  2) Will f/u with tachycardia postpartum.  If still persistently significantly elevated, will order EKG. No prior h/o cardiac arrhythmias.   Hildred LaserAnika Santonio Speakman, MD 03/27/2015 9:27 PM

## 2015-03-28 ENCOUNTER — Encounter: Payer: Medicaid Other | Admitting: Obstetrics and Gynecology

## 2015-03-28 ENCOUNTER — Encounter: Payer: Self-pay | Admitting: *Deleted

## 2015-03-28 LAB — CBC
HCT: 31.9 % — ABNORMAL LOW (ref 35.0–47.0)
Hemoglobin: 10.3 g/dL — ABNORMAL LOW (ref 12.0–16.0)
MCH: 28.1 pg (ref 26.0–34.0)
MCHC: 32.2 g/dL (ref 32.0–36.0)
MCV: 87.3 fL (ref 80.0–100.0)
Platelets: 142 10*3/uL — ABNORMAL LOW (ref 150–440)
RBC: 3.66 MIL/uL — ABNORMAL LOW (ref 3.80–5.20)
RDW: 12.7 % (ref 11.5–14.5)
WBC: 13.6 10*3/uL — AB (ref 3.6–11.0)

## 2015-03-28 LAB — RPR: RPR: NONREACTIVE

## 2015-03-28 MED ORDER — PRENATAL MULTIVITAMIN CH
1.0000 | ORAL_TABLET | Freq: Every day | ORAL | Status: DC
  Administered 2015-03-28: 1 via ORAL
  Filled 2015-03-28: qty 1

## 2015-03-28 MED ORDER — MEASLES, MUMPS & RUBELLA VAC ~~LOC~~ INJ
0.5000 mL | INJECTION | Freq: Once | SUBCUTANEOUS | Status: AC
  Administered 2015-03-29: 0.5 mL via SUBCUTANEOUS
  Filled 2015-03-28 (×2): qty 0.5

## 2015-03-28 MED ORDER — WITCH HAZEL-GLYCERIN EX PADS: 1.0000 "application " | MEDICATED_PAD | CUTANEOUS | Status: DC | PRN

## 2015-03-28 MED ORDER — MAGNESIUM HYDROXIDE 400 MG/5ML PO SUSP: 30.0000 mL | ORAL | Status: DC | PRN

## 2015-03-28 MED ORDER — ONDANSETRON HCL 4 MG PO TABS: 4.0000 mg | ORAL_TABLET | ORAL | Status: DC | PRN

## 2015-03-28 MED ORDER — ZOLPIDEM TARTRATE 5 MG PO TABS: 5.0000 mg | ORAL_TABLET | Freq: Every evening | ORAL | Status: DC | PRN

## 2015-03-28 MED ORDER — SIMETHICONE 80 MG PO CHEW
80.0000 mg | CHEWABLE_TABLET | ORAL | Status: DC | PRN
Start: 2015-03-28 — End: 2015-03-29

## 2015-03-28 MED ORDER — ACETAMINOPHEN 325 MG PO TABS: 650.0000 mg | ORAL_TABLET | ORAL | Status: DC | PRN

## 2015-03-28 MED ORDER — LANOLIN HYDROUS EX OINT: TOPICAL_OINTMENT | CUTANEOUS | Status: DC | PRN

## 2015-03-28 MED ORDER — DIPHENHYDRAMINE HCL 25 MG PO CAPS: 25.0000 mg | ORAL_CAPSULE | Freq: Four times a day (QID) | ORAL | Status: DC | PRN

## 2015-03-28 MED ORDER — LACTATED RINGERS IV SOLN: INTRAVENOUS | Status: DC

## 2015-03-28 MED ORDER — BENZOCAINE-MENTHOL 20-0.5 % EX AERO: 1.0000 "application " | INHALATION_SPRAY | CUTANEOUS | Status: DC | PRN

## 2015-03-28 MED ORDER — IBUPROFEN 600 MG PO TABS
600.0000 mg | ORAL_TABLET | Freq: Four times a day (QID) | ORAL | Status: DC
Start: 2015-03-28 — End: 2015-03-29
  Administered 2015-03-28 – 2015-03-29 (×5): 600 mg via ORAL
  Filled 2015-03-28 (×5): qty 1

## 2015-03-28 MED ORDER — DIBUCAINE 1 % RE OINT: 1.0000 "application " | TOPICAL_OINTMENT | RECTAL | Status: DC | PRN

## 2015-03-28 MED ORDER — ONDANSETRON HCL 4 MG/2ML IJ SOLN: 4.0000 mg | INTRAMUSCULAR | Status: DC | PRN

## 2015-03-28 NOTE — Progress Notes (Addendum)
Post Partum Day #1  Subjective: no complaints, up ad lib and tolerating PO  Objective: Blood pressure 109/66, pulse 77, temperature 97.8 F (36.6 C), temperature source Oral, resp. rate 22, height 5\' 4"  (1.626 m), weight 108.863 kg (240 lb), SpO2 100 %, unknown if currently breastfeeding.  Pulse max 148 overnight.   Physical Exam:  General: alert and cooperative Lochia: appropriate Uterine Fundus: firm Incision: none DVT Evaluation: No evidence of DVT seen on physical exam.   Recent Labs  03/27/15 1654 03/28/15 0613  HGB 11.9* 10.3*  HCT 36.7 31.9*    Assessment/Plan: Tachycardia - present during labor and immediate postpartum, however has resolved by this morning.  Currently regular rate and rhythm.  Plan for discharge tomorrow, Breastfeeding and Contraception undecided    LOS: 1 day   Hildred Laser 03/28/2015, 7:55 AM

## 2015-03-28 NOTE — Anesthesia Postprocedure Evaluation (Signed)
  Anesthesia Post-op Note  Patient: Christie Harrison  Procedure(s) Performed: * No procedures listed *  Anesthesia type:Epidural  Patient location: PACU  Post pain: Pain level controlled  Post assessment: Post-op Vital signs reviewed, Patient's Cardiovascular Status Stable, Respiratory Function Stable, Patent Airway and No signs of Nausea or vomiting  Post vital signs: Reviewed and stable  Last Vitals:  Filed Vitals:   03/28/15 0743  BP: 109/66  Pulse: 77  Temp: 36.6 C  Resp:     Level of consciousness: awake, alert  and patient cooperative  Complications: No apparent anesthesia complications

## 2015-03-29 MED ORDER — DOCUSATE SODIUM 100 MG PO CAPS: 100.0000 mg | ORAL_CAPSULE | Freq: Two times a day (BID) | ORAL | Status: DC | PRN

## 2015-03-29 MED ORDER — IBUPROFEN 800 MG PO TABS: 800.0000 mg | ORAL_TABLET | Freq: Three times a day (TID) | ORAL | Status: DC | PRN

## 2015-03-29 MED ORDER — FERROUS SULFATE 325 (65 FE) MG PO TABS: 325.0000 mg | ORAL_TABLET | Freq: Every day | ORAL | Status: AC

## 2015-03-29 NOTE — Discharge Summary (Addendum)
Obstetric Discharge Summary Reason for Admission: onset of labor Prenatal Procedures: none Intrapartum Procedures: spontaneous vaginal delivery Postpartum Procedures: Rubella Ig Complications-Operative and Postpartum: right labial laceration  CBC Latest Ref Rng 03/28/2015 03/27/2015  WBC 3.6 - 11.0 K/uL 13.6(H) 13.8(H)  Hemoglobin 12.0 - 16.0 g/dL 10.3(L) 11.9(L)  Hematocrit 35.0 - 47.0 % 31.9(L) 36.7  Platelets 150 - 440 K/uL 142(L) 191    Physical Exam:  General: alert, cooperative and no distress Lochia: appropriate Uterine Fundus: firm Incision: none DVT Evaluation: No evidence of DVT seen on physical exam.  Discharge Diagnoses: Term Pregnancy-delivered  Discharge Information: Date: 03/29/2015 Activity: pelvic rest x 6 weeks Diet: routine Medications: PNV, Ibuprofen, Colace and Iron Condition: stable Instructions: refer to practice specific booklet Discharge to: home Follow-up Information    Follow up with Hildred Laser, MD In 6 weeks.   Specialties:  Obstetrics and Gynecology, Radiology   Why:  For postpartum visit   Contact information:   [address] [place] [address] [phone]       Newborn Data: Live born female  Birth Weight: 6 lb 7.7 oz (2940 g) APGAR: 8, 9  Home with mother.  Hildred Laser 03/29/2015, 1:32 PM

## 2015-03-29 NOTE — Discharge Instructions (Signed)

## 2015-03-29 NOTE — Progress Notes (Addendum)
Post Partum Day #2  Subjective: no complaints, up ad lib and tolerating PO  Objective: Blood pressure 123/73, pulse 95, temperature 98 F (36.7 C), temperature source Oral, resp. rate 22, height 5' 4"  (1.626 m), weight 108.863 kg (240 lb), SpO2 100 %, unknown if currently breastfeeding.   Physical Exam:  General: alert and cooperative Lochia: appropriate Uterine Fundus: firm Incision: none DVT Evaluation: No evidence of DVT seen on physical exam.   Recent Labs  03/27/15 1654 03/28/15 0613  HGB 11.9* 10.3*  HCT 36.7 31.9*    Assessment/Plan:  Plan for discharge today, Breastfeeding and Contraception: considering partner vasectomy.  To f/u in 6 weeks in clinic for routine postpartum visit.  Given precautions on postpartum blues and depression.  Needs MMR prior to discharge for Rubella non-immune status.     LOS: 2 days   Rubie Maid 03/29/2015, 7:55 AM

## 2015-04-04 NOTE — Progress Notes (Addendum)
L&D OB Triage Note  Christie Harrison is a 34 y.o. [id number] female at [redacted]w[redacted]d, EDD Estimated Date of Delivery: 03/31/15 who presented to triage for complaints of contractions.  She was evaluated by the nurses with no significant findings.  She was diagnosed as early labor. Vital signs stable. An NST was performed and has been reviewed by MD.    NST INTERPRETATION: Indications: rule out uterine contractions  Mode: External Baseline Rate (A): 125 bpm Variability: Moderate Accelerations: 15 x 15 Decelerations: None     Contraction Frequency (min): 3-7  Impression: reactive   Plan: NST performed was reviewed and was found to be reactive. She was discharged home with bleeding/labor precautions.  Continue routine prenatal care. Follow up with OB/GYN as previously scheduled.     Hildred Laser, MD

## 2015-05-08 ENCOUNTER — Ambulatory Visit (INDEPENDENT_AMBULATORY_CARE_PROVIDER_SITE_OTHER): Payer: Medicaid Other | Admitting: Obstetrics and Gynecology

## 2015-05-08 ENCOUNTER — Encounter: Payer: Self-pay | Admitting: Obstetrics and Gynecology

## 2015-05-08 VITALS — BP 116/78 | HR 77 | Ht 65.0 in | Wt 210.9 lb

## 2015-05-08 DIAGNOSIS — Z862 Personal history of diseases of the blood and blood-forming organs and certain disorders involving the immune mechanism: Secondary | ICD-10-CM

## 2015-05-08 NOTE — Progress Notes (Signed)
Subjective:     Christie Harrison is a 34 y.o. 62P2002 female who presents for a postpartum visit. She is 6 weeks postpartum following a spontaneous vaginal delivery. I have fully reviewed the prenatal and intrapartum course. The delivery was at 39 gestational weeks. Outcome: spontaneous vaginal delivery. Anesthesia: epidural. Postpartum course has been well. Baby's course has been well. Baby is feeding by breast. Bleeding: no bleeding, patient has not resumed menses. Bowel function is normal. Bladder function is normal. Patient is not sexually active. Contraception method is undecided, but considering female vasectomy. Postpartum depression screening: negative.  The following portions of the patient's history were reviewed and updated as appropriate: allergies, current medications, past family history, past medical history, past social history, past surgical history and problem list.  Review of Systems A comprehensive review of systems was negative.   Objective:    BP 116/78 mmHg  Pulse 77  Ht 5\' 5"  (1.651 m)  Wt 210 lb 14.4 oz (95.664 kg)  BMI 35.10 kg/m2  Breastfeeding? Yes  General:  alert and no distress   Breasts:  inspection negative, no nipple discharge or bleeding, no masses or nodularity palpable  Lungs: clear to auscultation bilaterally  Heart:  regular rate and rhythm, S1, S2 normal, no murmur, click, rub or gallop  Abdomen: soft, non-tender; bowel sounds normal; no masses,  no organomegaly   Vulva:  normal  Vagina: normal vagina, no discharge, exudate, lesion, or erythema  Cervix:  multiparous appearance, no cervical motion tenderness and no lesions  Corpus: normal and enlarged, 10-12 weeks size  Adnexa:  not evaluated  Rectal Exam: Not performed.        Assessment:     Normal postpartum exam. Pap smear not done at today's visit.   H/o anemia in pregnancy and postpartum.   Plan:    1. Contraception: condoms and vasectomy to be planned.  2. Will check Hgb for h/o anemia 3.  To f/u in 3 months for annual exam   Hildred LaserAnika Kahleel Fadeley, MD [facility]

## 2015-05-09 LAB — HEMOGLOBIN AND HEMATOCRIT, BLOOD
Hematocrit: 38.6 % (ref 34.0–46.6)
Hemoglobin: 13.1 g/dL (ref 11.1–15.9)

## 2015-05-10 ENCOUNTER — Telehealth: Payer: Self-pay

## 2015-05-10 NOTE — Telephone Encounter (Signed)
-----   Message from Hildred Laser, MD sent at 05/09/2015  8:00 AM EDT ----- Patient's Hgb normal.  Can discontinue iron supplements.

## 2015-05-10 NOTE — Telephone Encounter (Signed)
Pt made aware of normal Hgb and to discontinue iron supplement.

## 2015-08-08 ENCOUNTER — Encounter: Payer: Medicaid Other | Admitting: Obstetrics and Gynecology
